# Patient Record
Sex: Female | Born: 1960 | Race: White | Hispanic: No | Marital: Married | Smoking: Never smoker
Health system: Southern US, Community
[De-identification: ages and names within clinical notes are randomized; demographics above are authoritative.]

## PROBLEM LIST (undated history)

## (undated) DIAGNOSIS — E041 Nontoxic single thyroid nodule: Secondary | ICD-10-CM

## (undated) DIAGNOSIS — I2699 Other pulmonary embolism without acute cor pulmonale: Secondary | ICD-10-CM

## (undated) DIAGNOSIS — F419 Anxiety disorder, unspecified: Secondary | ICD-10-CM

## (undated) DIAGNOSIS — Z8051 Family history of malignant neoplasm of kidney: Secondary | ICD-10-CM

## (undated) DIAGNOSIS — E119 Type 2 diabetes mellitus without complications: Secondary | ICD-10-CM

## (undated) DIAGNOSIS — L409 Psoriasis, unspecified: Secondary | ICD-10-CM

## (undated) DIAGNOSIS — K760 Fatty (change of) liver, not elsewhere classified: Secondary | ICD-10-CM

## (undated) DIAGNOSIS — I10 Essential (primary) hypertension: Secondary | ICD-10-CM

## (undated) DIAGNOSIS — C801 Malignant (primary) neoplasm, unspecified: Secondary | ICD-10-CM

## (undated) HISTORY — PX: CHOLECYSTECTOMY: SHX55

## (undated) HISTORY — PX: SHOULDER SURGERY: SHX246

## (undated) HISTORY — PX: PARTIAL NEPHRECTOMY: SHX414

---

## 1997-11-25 ENCOUNTER — Ambulatory Visit (HOSPITAL_COMMUNITY): Admission: RE | Admit: 1997-11-25 | Discharge: 1997-11-25 | Payer: Self-pay | Admitting: Obstetrics and Gynecology

## 1998-01-10 ENCOUNTER — Inpatient Hospital Stay (HOSPITAL_COMMUNITY): Admission: AD | Admit: 1998-01-10 | Discharge: 1998-01-12 | Payer: Self-pay | Admitting: Obstetrics and Gynecology

## 1998-01-24 ENCOUNTER — Inpatient Hospital Stay (HOSPITAL_COMMUNITY): Admission: AD | Admit: 1998-01-24 | Discharge: 1998-01-28 | Payer: Self-pay | Admitting: Obstetrics and Gynecology

## 1998-01-28 ENCOUNTER — Encounter (HOSPITAL_COMMUNITY): Admission: RE | Admit: 1998-01-28 | Discharge: 1998-04-07 | Payer: Self-pay | Admitting: *Deleted

## 1998-03-01 ENCOUNTER — Other Ambulatory Visit: Admission: RE | Admit: 1998-03-01 | Discharge: 1998-03-01 | Payer: Self-pay | Admitting: Obstetrics and Gynecology

## 2005-08-28 ENCOUNTER — Encounter: Payer: Self-pay | Admitting: Obstetrics and Gynecology

## 2006-07-11 ENCOUNTER — Emergency Department (HOSPITAL_COMMUNITY): Admission: EM | Admit: 2006-07-11 | Discharge: 2006-07-11 | Payer: Self-pay | Admitting: Emergency Medicine

## 2006-07-13 ENCOUNTER — Emergency Department (HOSPITAL_COMMUNITY): Admission: EM | Admit: 2006-07-13 | Discharge: 2006-07-13 | Payer: Self-pay | Admitting: Emergency Medicine

## 2006-07-14 ENCOUNTER — Encounter: Admission: RE | Admit: 2006-07-14 | Discharge: 2006-07-14 | Payer: Self-pay | Admitting: Orthopaedic Surgery

## 2006-08-04 ENCOUNTER — Ambulatory Visit (HOSPITAL_COMMUNITY): Admission: RE | Admit: 2006-08-04 | Discharge: 2006-08-05 | Payer: Self-pay | Admitting: Orthopaedic Surgery
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
## 2017-11-06 ENCOUNTER — Ambulatory Visit: Payer: No Typology Code available for payment source | Admitting: Podiatry

## 2017-11-06 ENCOUNTER — Encounter: Payer: Self-pay | Admitting: Podiatry

## 2017-11-06 VITALS — BP 143/83 | HR 74 | Temp 98.7°F | Resp 16 | Ht 66.0 in | Wt 273.0 lb

## 2017-11-06 DIAGNOSIS — L603 Nail dystrophy: Secondary | ICD-10-CM

## 2017-11-06 DIAGNOSIS — L853 Xerosis cutis: Secondary | ICD-10-CM

## 2017-11-06 DIAGNOSIS — E1149 Type 2 diabetes mellitus with other diabetic neurological complication: Secondary | ICD-10-CM

## 2017-11-06 MED ORDER — AMMONIUM LACTATE 12 % EX CREA: TOPICAL_CREAM | CUTANEOUS | 0 refills | Status: DC | PRN

## 2017-11-06 NOTE — Progress Notes (Signed)
   Subjective:    Patient ID: Sara Page, female    DOB: April 27, 1961, 57 y.o.   MRN: [id number]  HPI 57 year old female presents the office today with concerns of a left toenail problem.  She states the nail has been long with starting to lift up.  She states that she has not noticed any drainage or redness or any swelling.  She has been put a Band-Aid on the area.  She previously had partial nail avulsions performed of both of her big toenails.  The right side is done well but the left side has become very Fan and narrow.  She also states that her skin is very dry in the bottom of her heels but denies any fissuring or open sores.   Review of Systems  All other systems reviewed and are negative.  No past medical history on file.     Current Outpatient Medications:  .  B Complex Vitamins (B-COMPLEX/B-12 PO), Take by mouth., Disp: , Rfl:  .  escitalopram (LEXAPRO) 5 MG tablet, Take 5 mg by mouth daily., Disp: , Rfl:  .  glipiZIDE (GLUCOTROL) 5 MG tablet, Take by mouth daily before breakfast., Disp: , Rfl:  .  insulin glargine (LANTUS) 100 UNIT/ML injection, Inject into the skin daily., Disp: , Rfl:  .  losartan (COZAAR) 100 MG tablet, Take 100 mg by mouth daily., Disp: , Rfl:  .  ammonium lactate (AMLACTIN) 12 % cream, Apply topically as needed for dry skin., Disp: 385 g, Rfl: 0  Allergies  Allergen Reactions  . Penicillins Hives         Objective:   Physical Exam  General: AAO x3, NAD  Dermatological: The left hallux toenail is dystrophic with discoloration of the nail.  There is no pain in the nails yesterday redness or drainage and clinical signs of infection present.  There is dry skin present plantar heels without any skin fissures open sores present.  Vascular: Dorsalis Pedis artery and Posterior Tibial artery pedal pulses are 2/4 bilateral with immedate capillary fill time. There is no pain with calf compression, swelling, warmth, erythema.   Neruologic: Grossly  intact via light touch bilateral. Protective threshold with Semmes Wienstein monofilament intact to all pedal sites bilateral.   Musculoskeletal: No gross boney pedal deformities bilateral. No pain, crepitus, or limitation noted with foot and ankle range of motion bilateral. Muscular strength 5/5 in all groups tested bilateral.  Gait: Unassisted, Nonantalgic.      Assessment & Plan:  57 year old female with onychodystrophy left hallux toenail, dry skin -Treatment options discussed including all alternatives, risks, and complications -Etiology of symptoms were discussed - I did debride the left hallux toenail so that any complications or bleeding.  Discussed total nail avulsion but she wished to hold off on this.  The nail is very dystrophic but I do not believe there is much we can do for other than keeping it trimmed. -Prescribed AmLactin for the dry skin. -Discussed the importance of daily foot inspection.  Trula Slade DPM

## 2017-11-06 NOTE — Patient Instructions (Addendum)
You can mix 2 tablespoons of coconut oil and 10-15 drops of tea tree oil together and apply to the nail once a day  If was nice to meet you today. If you have any questions or any further concerns, please feel fee to give me a call. You can call our office at [phone] or please feel fee to send me a message through [place].      Diabetes and Foot Care Diabetes may cause you to have problems because of poor blood supply (circulation) to your feet and legs. This may cause the skin on your feet to become thinner, break easier, and heal more slowly. Your skin may become dry, and the skin may peel and crack. You may also have nerve damage in your legs and feet causing decreased feeling in them. You may not notice minor injuries to your feet that could lead to infections or more serious problems. Taking care of your feet is one of the most important things you can do for yourself. Follow these instructions at home:  Wear shoes at all times, even in the house. Do not go barefoot. Bare feet are easily injured.  Check your feet daily for blisters, cuts, and redness. If you cannot see the bottom of your feet, use a mirror or ask someone for help.  Wash your feet with warm water (do not use hot water) and mild soap. Then pat your feet and the areas between your toes until they are completely dry. Do not soak your feet as this can dry your skin.  Apply a moisturizing lotion or petroleum jelly (that does not contain alcohol and is unscented) to the skin on your feet and to dry, brittle toenails. Do not apply lotion between your toes.  Trim your toenails straight across. Do not dig under them or around the cuticle. File the edges of your nails with an emery board or nail file.  Do not cut corns or calluses or try to remove them with medicine.  Wear clean socks or stockings every day. Make sure they are not too tight. Do not wear knee-high stockings since they may decrease blood flow to your  legs.  Wear shoes that fit properly and have enough cushioning. To break in new shoes, wear them for just a few hours a day. This prevents you from injuring your feet. Always look in your shoes before you put them on to be sure there are no objects inside.  Do not cross your legs. This may decrease the blood flow to your feet.  If you find a minor scrape, cut, or break in the skin on your feet, keep it and the skin around it clean and dry. These areas may be cleansed with mild soap and water. Do not cleanse the area with peroxide, alcohol, or iodine.  When you remove an adhesive bandage, be sure not to damage the skin around it.  If you have a wound, look at it several times a day to make sure it is healing.  Do not use heating pads or hot water bottles. They may burn your skin. If you have lost feeling in your feet or legs, you may not know it is happening until it is too late.  Make sure your health care provider performs a complete foot exam at least annually or more often if you have foot problems. Report any cuts, sores, or bruises to your health care provider immediately. Contact a health care provider if:  You have an injury  that is not healing.  You have cuts or breaks in the skin.  You have an ingrown nail.  You notice redness on your legs or feet.  You feel burning or tingling in your legs or feet.  You have pain or cramps in your legs and feet.  Your legs or feet are numb.  Your feet always feel cold. Get help right away if:  There is increasing redness, swelling, or pain in or around a wound.  There is a red line that goes up your leg.  Pus is coming from a wound.  You develop a fever or as directed by your health care provider.  You notice a bad smell coming from an ulcer or wound. This information is not intended to replace advice given to you by your health care provider. Make sure you discuss any questions you have with your health care provider. Document  Released: 04/12/2000 Document Revised: 09/21/2015 Document Reviewed: 09/22/2012 Elsevier Interactive Patient Education  2017 [employer].

## 2019-06-26 ENCOUNTER — Ambulatory Visit: Payer: Self-pay | Attending: Internal Medicine

## 2019-06-26 DIAGNOSIS — Z23 Encounter for immunization: Secondary | ICD-10-CM | POA: Insufficient documentation

## 2019-06-26 NOTE — Progress Notes (Signed)
   Covid-19 Vaccination Clinic  Name:  Sara Page    MRN: [id number] DOB: Oct 08, 1960  06/26/2019  [name] was observed post Covid-19 immunization for 30 minutes based on pre-vaccination screening without incidence. She was provided with Vaccine Information Sheet and instruction to access the V-Safe system.   [name] was instructed to call 911 with any severe reactions post vaccine: Marland Kitchen Difficulty breathing  . Swelling of your face and throat  . A fast heartbeat  . A bad rash all over your body  . Dizziness and weakness    Immunizations Administered    Name Date Dose VIS Date Route   Pfizer COVID-19 Vaccine 06/26/2019  1:22 PM 0.3 mL 04/09/2019 Intramuscular   Manufacturer: [place]   Lot: [id number]   [place]: [id number]

## 2019-07-17 ENCOUNTER — Ambulatory Visit: Payer: Self-pay | Attending: Internal Medicine

## 2019-07-17 DIAGNOSIS — Z23 Encounter for immunization: Secondary | ICD-10-CM

## 2019-07-17 NOTE — Progress Notes (Signed)
   Covid-19 Vaccination Clinic  Name:  KIYRA FAUROT    MRN: [id number] DOB: Jun 12, 1960  07/17/2019  Ms. Masters was observed post Covid-19 immunization for 15 minutes without incident. She was provided with Vaccine Information Sheet and instruction to access the V-Safe system.   Ms. Jemerson was instructed to call 911 with any severe reactions post vaccine: Marland Kitchen Difficulty breathing  . Swelling of face and throat  . A fast heartbeat  . A bad rash all over body  . Dizziness and weakness   Immunizations Administered    Name Date Dose VIS Date Route   Pfizer COVID-19 Vaccine 07/17/2019  9:52 AM 0.3 mL 04/09/2019 Intramuscular   Manufacturer: [place]   Lot: [id number]   [place]: [id number]

## 2020-01-05 ENCOUNTER — Encounter (HOSPITAL_BASED_OUTPATIENT_CLINIC_OR_DEPARTMENT_OTHER): Payer: Self-pay | Admitting: Emergency Medicine

## 2020-01-05 ENCOUNTER — Emergency Department (HOSPITAL_BASED_OUTPATIENT_CLINIC_OR_DEPARTMENT_OTHER)
Admission: EM | Admit: 2020-01-05 | Discharge: 2020-01-05 | Disposition: A | Discharge status: Home / Self Care | Payer: BC Managed Care – PPO | Attending: Emergency Medicine | Admitting: Emergency Medicine

## 2020-01-05 ENCOUNTER — Emergency Department (HOSPITAL_BASED_OUTPATIENT_CLINIC_OR_DEPARTMENT_OTHER): Payer: BC Managed Care – PPO

## 2020-01-05 DIAGNOSIS — C649 Malignant neoplasm of unspecified kidney, except renal pelvis: Secondary | ICD-10-CM | POA: Insufficient documentation

## 2020-01-05 DIAGNOSIS — Z79899 Other long term (current) drug therapy: Secondary | ICD-10-CM | POA: Insufficient documentation

## 2020-01-05 DIAGNOSIS — I10 Essential (primary) hypertension: Secondary | ICD-10-CM | POA: Insufficient documentation

## 2020-01-05 DIAGNOSIS — E119 Type 2 diabetes mellitus without complications: Secondary | ICD-10-CM | POA: Insufficient documentation

## 2020-01-05 DIAGNOSIS — M25511 Pain in right shoulder: Secondary | ICD-10-CM | POA: Insufficient documentation

## 2020-01-05 HISTORY — DX: Nontoxic single thyroid nodule: E04.1

## 2020-01-05 HISTORY — DX: Fatty (change of) liver, not elsewhere classified: K76.0

## 2020-01-05 HISTORY — DX: Malignant (primary) neoplasm, unspecified: C80.1

## 2020-01-05 HISTORY — DX: Family history of malignant neoplasm of kidney: Z80.51

## 2020-01-05 HISTORY — DX: Essential (primary) hypertension: I10

## 2020-01-05 HISTORY — DX: Psoriasis, unspecified: L40.9

## 2020-01-05 HISTORY — DX: Other pulmonary embolism without acute cor pulmonale: I26.99

## 2020-01-05 HISTORY — DX: Anxiety disorder, unspecified: F41.9

## 2020-01-05 HISTORY — DX: Type 2 diabetes mellitus without complications: E11.9

## 2020-01-05 MED ORDER — METHOCARBAMOL 500 MG PO TABS: 500.0000 mg | ORAL_TABLET | Freq: Two times a day (BID) | ORAL | 0 refills | Status: DC

## 2020-01-05 MED ORDER — OXYCODONE-ACETAMINOPHEN 5-325 MG PO TABS
1.0000 | ORAL_TABLET | Freq: Once | ORAL | Status: AC
  Administered 2020-01-05: 1 via ORAL
  Filled 2020-01-05: qty 1

## 2020-01-05 MED ORDER — NAPROXEN 375 MG PO TABS: 375.0000 mg | ORAL_TABLET | Freq: Two times a day (BID) | ORAL | 0 refills | Status: AC

## 2020-01-05 NOTE — ED Provider Notes (Signed)
[facility] EMERGENCY DEPARTMENT Provider Note   CSN: [id number] Arrival date & time: 01/05/20  0606     History Chief Complaint  Patient presents with  . Shoulder Pain    Sara Page is a 59 y.o. female past medical history of diabetes, hypertension, thyroid nodule who presents for evaluation of right shoulder pain that began yesterday.  She denies any preceding trauma, injury, fall.  She states that overnight, the pain significantly worsened.  She reports limited range of motion of her shoulder secondary to pain.  She has not noticed any warmth.  She states she always has some erythema to her skin but has not noticed any changes in her skin.  She denies any fevers, numbness/weakness of the arm.  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  She does not do any heavy lifting but does report she did some occasional movement of the upper arm during her job.  The history is provided by the patient.       Past Medical History:  Diagnosis Date  . Anxiety   . Cancer ([place])   . Diabetes mellitus without complication ([place])   . FH: kidney cancer   . Hypertension   . NAFLD (nonalcoholic fatty liver disease)   . Psoriasis   . Pulmonary emboli ([place])   . Thyroid nodule     There are no problems to display for this patient.   Past Surgical History:  Procedure Laterality Date  . CESAREAN SECTION    . CHOLECYSTECTOMY    . PARTIAL NEPHRECTOMY    . SHOULDER SURGERY       OB History   No obstetric history on file.     History reviewed. No pertinent family history.  Social History   Tobacco Use  . Smoking status: Never Smoker  . Smokeless tobacco: Never Used  Vaping Use  . Vaping Use: Never used  Substance Use Topics  . Alcohol use: Never  . Drug use: Never    Home Medications Prior to Admission medications   Medication Sig Start Date End Date Taking? Authorizing Provider  ammonium lactate (AMLACTIN) 12 % cream Apply topically as needed for dry  skin. 11/06/17   Trula Slade, DPM  B Complex Vitamins (B-COMPLEX/B-12 PO) Take by mouth.    [provider]  escitalopram (LEXAPRO) 5 MG tablet Take 5 mg by mouth daily.    [provider]  glipiZIDE (GLUCOTROL) 5 MG tablet Take by mouth daily before breakfast.    [provider]  insulin glargine (LANTUS) 100 UNIT/ML injection Inject into the skin daily.    [provider]  losartan (COZAAR) 100 MG tablet Take 100 mg by mouth daily.    [provider]  methocarbamol (ROBAXIN) 500 MG tablet Take 1 tablet (500 mg total) by mouth 2 (two) times daily. 01/05/20   Volanda Napoleon, PA-C  naproxen (NAPROSYN) 375 MG tablet Take 1 tablet (375 mg total) by mouth 2 (two) times daily for 7 days. 01/05/20 01/12/20  Volanda Napoleon, PA-C    Allergies    Penicillins  Review of Systems   Review of Systems  Constitutional: Negative for fever.  Musculoskeletal:       Right shoulder pain  Skin: Negative for color change.  Neurological: Negative for weakness and numbness.  All other systems reviewed and are negative.   Physical Exam Updated Vital Signs BP (!) 158/65 (BP Location: Left Arm)   Pulse 90   Temp 98.5 F (  36.9 C) (Oral)   Resp 20   Ht 5\' 6"  (1.676 m)   Wt 121.6 kg   SpO2 98%   BMI 43.26 kg/m   Physical Exam Vitals and nursing note reviewed.  Constitutional:      Appearance: She is well-developed.  HENT:     Head: Normocephalic and atraumatic.  Eyes:     General: No scleral icterus.       Right eye: No discharge.        Left eye: No discharge.     Conjunctiva/sclera: Conjunctivae normal.  Cardiovascular:     Pulses:          Radial pulses are 2+ on the right side and 2+ on the left side.  Pulmonary:     Effort: Pulmonary effort is normal.  Musculoskeletal:     Comments: Tenderness palpation noted to right shoulder.  No deformity or crepitus noted.  No overlying warmth, erythema, edema.  Limited range of motion secondary to  pain.  I am only able to get to about 80 degrees of flexion before she has a a lot of pain.  Additionally, I am unable to fully abduct her right shoulder.  No bony tenderness noted to right elbow, right forearm, right wrist.  Unable to assess Neer's impingement.  Positive Hawkins.  Positive empty can test.  Unable to assess liftoff test.  No bony tenderness in left shoulder.  She does have some limited flexion of left shoulder secondary to previous injury.  Negative Neer's, Hawkins, liftoff, liftoff test.  Skin:    General: Skin is warm and dry.     Capillary Refill: Capillary refill takes less than 2 seconds.     Comments: Good distal cap refill.  RUE is not dusky in appearance or cool to touch.  Neurological:     Mental Status: She is alert.     Comments: 5/5 strength BUE  Sensation intact along major nerve distributions of BUE  Psychiatric:        Speech: Speech normal.        Behavior: Behavior normal.     ED Results / Procedures / Treatments   Labs (all labs ordered are listed, but only abnormal results are displayed) Labs Reviewed - No data to display  EKG None  Radiology DG Shoulder Right  Result Date: 01/05/2020 CLINICAL DATA:  Acute right shoulder pain without known injury. EXAM: RIGHT SHOULDER - 2+ VIEW COMPARISON:  None. FINDINGS: There is no evidence of fracture or dislocation. There is no evidence of arthropathy. Calcification is seen over greater tuberosity suggesting calcific tendinosis. Soft tissues are unremarkable. 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 No acute abnormality seen in the right shoulder. Electronically Signed   By: Marijo Conception M.D.   On: 01/05/2020 09:58    Procedures Procedures (including critical care time)  Medications Ordered in ED Medications  oxyCODONE-acetaminophen (PERCOCET/ROXICET) 5-325 MG per tablet 1 tablet (1 tablet Oral Given 01/05/20 1027)    ED Course  I have reviewed the triage vital signs and the nursing  notes.  Pertinent labs & imaging results that were available during my care of the patient were reviewed by me and considered in my medical decision making (see chart for details).    MDM Rules/Calculators/A&P                          59 year old female who presents for evaluation of right shoulder pain that began yesterday.  No  preceding trauma, injury, fall.  Limited range of motion secondary to pain.  Initial arrival, she is afebrile, nontoxic-appearing.  Vital signs are stable.  She is neurovascularly intact.  Concern for possible rotator cuff impingement versus tendinitis.  History/physical exam not concerning for DVT, septic arthritis, ischemic limb.  Additionally, history/physical exam is not concerning for CVA.  Patient given analgesics.  Will assess x-ray for any bony abnormality.  X-ray reviewed.  No acute bony abnormality.  There is probable calcific tendinosis of the rotator cuff.  Discussed results with patient.  Will give patient short course of muscle relaxers and NSAIDs for additional pain relief.  Patient placed in sling immobilizer and encouraged on use.  Patient given outpatient orthopedic referral for further evaluation of her symptoms. At this time, patient exhibits no emergent life-threatening condition that require further evaluation in ED or admission. Patient had ample opportunity for questions and discussion. All patient's questions were answered with full understanding. Strict return precautions discussed. Patient expresses understanding and agreement to plan.   Portions of this note were generated with [employer]. Dictation errors may occur despite best attempts at proofreading.  Final Clinical Impression(s) / ED Diagnoses Final diagnoses:  Acute pain of right shoulder    Rx / DC Orders ED Discharge Orders         Ordered    methocarbamol (ROBAXIN) 500 MG tablet  2 times daily        01/05/20 1025    naproxen (NAPROSYN) 375 MG tablet  2 times daily         01/05/20 1025           Desma Mcgregor 01/05/20 1038    Hayden Rasmussen, MD 01/05/20 [phone]

## 2020-01-05 NOTE — ED Triage Notes (Signed)
Pt states she started having pain in her right shoulder yesterday that has progressively gotten worse over night  Pt states today she is hardly able to move her right arm due to the pain  Pt states she has not taken anything for the pain

## 2020-01-05 NOTE — Discharge Instructions (Addendum)
Take Naproxen as directed. As we discussed you can  take tylenol with this medication but you should not take ibuprofen at the same time as taking this medication.   Take Robaxin as prescribed. This medication will make you drowsy so do not drive or drink alcohol when taking it.  Wear sling immobilizer for support and stabilization.  As we discussed, you should not wear it 24/7.  At home, he should take it out and do gentle range of motion exercises to prevent frozen shoulder syndrome.  Follow-up with referred orthopedic doctor for further evaluation.  Return For any worsening pain, numbness, redness or warmth, fevers or any other worsening or concerning symptoms.

## 2023-05-25 ENCOUNTER — Other Ambulatory Visit: Payer: Self-pay

## 2023-05-25 ENCOUNTER — Encounter (HOSPITAL_COMMUNITY): Payer: Self-pay

## 2023-05-25 ENCOUNTER — Emergency Department (HOSPITAL_COMMUNITY)
Admission: EM | Admit: 2023-05-25 | Discharge: 2023-05-25 | Disposition: A | Discharge status: Home / Self Care | Payer: No Typology Code available for payment source | Attending: Emergency Medicine | Admitting: Emergency Medicine

## 2023-05-25 DIAGNOSIS — I10 Essential (primary) hypertension: Secondary | ICD-10-CM | POA: Diagnosis not present

## 2023-05-25 DIAGNOSIS — D45 Polycythemia vera: Secondary | ICD-10-CM | POA: Diagnosis not present

## 2023-05-25 DIAGNOSIS — R002 Palpitations: Secondary | ICD-10-CM | POA: Diagnosis present

## 2023-05-25 DIAGNOSIS — I480 Paroxysmal atrial fibrillation: Secondary | ICD-10-CM | POA: Diagnosis not present

## 2023-05-25 DIAGNOSIS — Z794 Long term (current) use of insulin: Secondary | ICD-10-CM | POA: Insufficient documentation

## 2023-05-25 DIAGNOSIS — Z79899 Other long term (current) drug therapy: Secondary | ICD-10-CM | POA: Diagnosis not present

## 2023-05-25 DIAGNOSIS — Z85528 Personal history of other malignant neoplasm of kidney: Secondary | ICD-10-CM | POA: Diagnosis not present

## 2023-05-25 DIAGNOSIS — Z7901 Long term (current) use of anticoagulants: Secondary | ICD-10-CM | POA: Insufficient documentation

## 2023-05-25 DIAGNOSIS — Z7984 Long term (current) use of oral hypoglycemic drugs: Secondary | ICD-10-CM | POA: Diagnosis not present

## 2023-05-25 DIAGNOSIS — D751 Secondary polycythemia: Secondary | ICD-10-CM

## 2023-05-25 DIAGNOSIS — E119 Type 2 diabetes mellitus without complications: Secondary | ICD-10-CM | POA: Diagnosis not present

## 2023-05-25 LAB — BASIC METABOLIC PANEL
Anion gap: 11 (ref 5–15)
BUN: 15 mg/dL (ref 8–23)
CO2: 19 mmol/L — ABNORMAL LOW (ref 22–32)
Calcium: 8.6 mg/dL — ABNORMAL LOW (ref 8.9–10.3)
Chloride: 108 mmol/L (ref 98–111)
Creatinine, Ser: 0.54 mg/dL (ref 0.44–1.00)
GFR, Estimated: >60 mL/min (ref >60)
Glucose, Bld: 206 mg/dL — ABNORMAL HIGH (ref 70–99)
Potassium: 4 mmol/L (ref 3.5–5.1)
Sodium: 138 mmol/L (ref 135–145)

## 2023-05-25 LAB — CBC
HCT: 47.6 % — ABNORMAL HIGH (ref 36.0–46.0)
Hemoglobin: 15.8 g/dL — ABNORMAL HIGH (ref 12.0–15.0)
MCH: 29.9 pg (ref 26.0–34.0)
MCHC: 33.2 g/dL (ref 30.0–36.0)
MCV: 90 fL (ref 80.0–100.0)
Platelets: 278 10*3/uL (ref 150–400)
RBC: 5.29 MIL/uL — ABNORMAL HIGH (ref 3.87–5.11)
RDW: 13.4 % (ref 11.5–15.5)
WBC: 7.9 10*3/uL (ref 4.0–10.5)
nRBC: 0 % (ref 0.0–0.2)

## 2023-05-25 LAB — MAGNESIUM: Magnesium: 2 mg/dL (ref 1.7–2.4)

## 2023-05-25 MED ORDER — APIXABAN 5 MG PO TABS
5.0000 mg | ORAL_TABLET | Freq: Once | ORAL | Status: AC
  Administered 2023-05-25: 5 mg via ORAL
  Filled 2023-05-25: qty 1

## 2023-05-25 MED ORDER — APIXABAN 5 MG PO TABS: 5.0000 mg | ORAL_TABLET | Freq: Two times a day (BID) | ORAL | 0 refills | Status: DC

## 2023-05-25 MED ORDER — PROPOFOL 10 MG/ML IV BOLUS: 0.5000 mg/kg | Freq: Once | INTRAVENOUS | Status: DC

## 2023-05-25 MED ORDER — PROPOFOL 10 MG/ML IV BOLUS
INTRAVENOUS | Status: AC | PRN
  Administered 2023-05-25: 58.05 mg via INTRAVENOUS

## 2023-05-25 NOTE — ED Provider Notes (Signed)
[facility] Provider Note   CSN: [id number] Arrival date & time: 05/25/23  0272     History  Chief Complaint  Patient presents with   Atrial Fibrillation    Sara Page is a 63 y.o. female.  The history is provided by the patient.  Atrial Fibrillation  She has history of hypertension, diabetes, hyperlipidemia, pulmonary embolism, renal cell carcinoma and comes in because of palpitations.  She noted before she went to sleep that her heart was racing and she felt slightly short of breath.  She went to sleep but woke up feeling worse.  She denies chest pain, heaviness, tightness, pressure.  She does feel that her heart is racing.  She denies nausea, vomiting, diaphoresis.  She has never had similar episodes in the past.  Of note, pulmonary embolism was a postoperative complication.  EMS noted atrial fibrillation with rapid ventricular response treated with diltiazem 20 mg which temporarily brought the heart rate down to the 120s.   Home Medications Prior to Admission medications   Medication Sig Start Date End Date Taking? Authorizing Provider  apixaban (ELIQUIS) 5 MG TABS tablet Take 1 tablet (5 mg total) by mouth 2 (two) times daily. 05/25/23 06/24/23 Yes Dione Booze, MD  ammonium lactate (AMLACTIN) 12 % cream Apply topically as needed for dry skin. 11/06/17   Vivi Barrack, DPM  B Complex Vitamins (B-COMPLEX/B-12 PO) Take by mouth.    [provider]  escitalopram (LEXAPRO) 5 MG tablet Take 5 mg by mouth daily.    [provider]  glipiZIDE (GLUCOTROL) 5 MG tablet Take by mouth daily before breakfast.    [provider]  insulin glargine (LANTUS) 100 UNIT/ML injection Inject into the skin daily.    [provider]  losartan (COZAAR) 100 MG tablet Take 100 mg by mouth daily.    [provider]  methocarbamol (ROBAXIN) 500 MG tablet Take 1 tablet (500 mg total) by mouth 2 (two) times  daily. 01/05/20   Maxwell Caul, PA-C      Allergies    Penicillins    Review of Systems   Review of Systems  All other systems reviewed and are negative.   Physical Exam Updated Vital Signs BP (!) 141/82   Pulse (!) 135   Resp 20   Ht 5\' 6"  (1.676 m)   Wt 116.1 kg   SpO2 96%   BMI 41.32 kg/m  Physical Exam Vitals and nursing note reviewed.   63 year old female, resting comfortably and in no acute distress. Vital signs are significant for elevated heart rate and borderline elevated blood pressure. Oxygen saturation is 96%, which is normal. Head is normocephalic and atraumatic. PERRLA, EOMI. Oropharynx is clear. Neck is nontender and supple without adenopathy or JVD. Lungs are clear without rales, wheezes, or rhonchi. Chest is nontender. Heart is tachycardic and irregular without murmur. Abdomen is soft, flat, nontender. Extremities have no cyanosis or edema,. Neurologic: Mental status is normal, cranial nerves are intact, there are no motor or sensory deficits.  ED Results / Procedures / Treatments   Labs (all labs ordered are listed, but only abnormal results are displayed) Labs Reviewed  CBC - Abnormal; Notable for the following components:      Result Value   RBC 5.29 (*)    Hemoglobin 15.8 (*)    HCT 47.6 (*)    All other components within normal limits  BASIC METABOLIC PANEL  MAGNESIUM  EKG EKG Interpretation Date/Time:  Sunday May 25 2023 05:40:54 EST Ventricular Rate:  150 PR Interval:    QRS Duration:  98 QT Interval:  300 QTC Calculation: 474 R Axis:   111  Text Interpretation: Atrial fibrillation Left posterior fascicular block ST depression, probably rate related When compared with ECG of 08/01/2006, Atrial fibrillation with rapid ventricular response has replaced Sinus rhythm Left posterior fasicular block is now Present Confirmed by Dione Booze ([id number]) on 05/25/2023 5:44:42 AM   EKG Interpretation Date/Time:  Sunday May 25 2023  07:46:45 EST Ventricular Rate:  100 PR Interval:  124 QRS Duration:  95 QT Interval:  356 QTC Calculation: 460 R Axis:   93  Text Interpretation: Sinus tachycardia Probable left atrial enlargement Right axis deviation When compared with ECG of EARLIER SAME DATE S1-S2-S3 pattern, consider pulmonary disease, RVH, or normal variant has replaced Atrial fibrillation with rapid ventricular response Confirmed by Dione Booze ([id number]) on 05/25/2023 7:51:29 AM         Procedures .Sedation  Date/Time: 05/25/2023 7:17 AM  Performed by: Dione Booze, MD Authorized by: Dione Booze, MD   Consent:    Consent obtained:  Verbal   Consent given by:  Patient   Risks discussed:  Allergic reaction, dysrhythmia, inadequate sedation, nausea, prolonged hypoxia resulting in organ damage, prolonged sedation necessitating reversal, respiratory compromise necessitating ventilatory assistance and intubation and vomiting   Alternatives discussed:  Analgesia without sedation, anxiolysis and regional anesthesia Universal protocol:    Procedure explained and questions answered to patient or proxy's satisfaction: yes     Relevant documents present and verified: yes     Test results available: yes     Imaging studies available: yes     Required blood products, implants, devices, and special equipment available: yes     Site/side marked: yes     Immediately prior to procedure, a time out was called: yes     Patient identity confirmed:  Verbally with patient and arm band Indications:    Procedure performed:  Cardioversion   Procedure necessitating sedation performed by:  Physician performing sedation Pre-sedation assessment:    Time since last food or drink:  6 hours   ASA classification: class 2 - patient with mild systemic disease     Mouth opening:  3 or more finger widths   Thyromental distance:  4 finger widths   Mallampati score:  I - soft palate, uvula, fauces, pillars visible   Neck mobility: normal      Pre-sedation assessments completed and reviewed: airway patency, cardiovascular function, hydration status, mental status, nausea/vomiting, pain level, respiratory function and temperature   A pre-sedation assessment was completed prior to the start of the procedure Immediate pre-procedure details:    Reassessment: Patient reassessed immediately prior to procedure     Reviewed: vital signs, relevant labs/tests and NPO status     Verified: bag valve mask available, emergency equipment available, intubation equipment available, IV patency confirmed, oxygen available and suction available   Procedure details (see MAR for exact dosages):    Preoxygenation:  Nasal cannula   Sedation:  Propofol   Intended level of sedation: deep   Intra-procedure monitoring:  Blood pressure monitoring, cardiac monitor, continuous pulse oximetry, frequent LOC assessments, frequent vital sign checks and continuous capnometry   Intra-procedure events: none     Total Provider sedation time (minutes):  30 Post-procedure details:   A post-sedation assessment was completed following the completion of the procedure.   Attendance: Constant attendance by certified  staff until patient recovered     Recovery: Patient returned to pre-procedure baseline     Post-sedation assessments completed and reviewed: airway patency, cardiovascular function, hydration status, mental status, nausea/vomiting, pain level, respiratory function and temperature     Patient is stable for discharge or admission: yes     Procedure completion:  Tolerated well, no immediate complications .Cardioversion  Date/Time: 05/25/2023 7:18 AM  Performed by: Dione Booze, MD Authorized by: Dione Booze, MD   Consent:    Consent obtained:  Written   Consent given by:  Patient   Risks discussed:  Cutaneous burn, death, pain and induced arrhythmia   Alternatives discussed:  Rate-control medication Pre-procedure details:    Cardioversion basis:   Emergent   Rhythm:  Atrial fibrillation   Electrode placement:  Anterior-posterior Patient sedated: Yes. Refer to sedation procedure documentation for details of sedation.  Attempt one:    Cardioversion mode:  Synchronous   Waveform:  Biphasic   Shock (Joules):  120   Shock outcome:  Conversion to normal sinus rhythm Post-procedure details:    Patient status:  Alert   Patient tolerance of procedure:  Tolerated well, no immediate complications   Cardiac monitor shows atrial fibrillation with rapid ventricular response.  Medications Ordered in ED Medications  apixaban (ELIQUIS) tablet 5 mg (has no administration in time range)  propofol (DIPRIVAN) 10 mg/mL bolus/IV push 58.1 mg (has no administration in time range)    ED Course/ Medical Decision Making/ A&P   Cardiac monitor shows atrial fibrillation with rapid ventricular response, per my interpretation.  Click here for ABCD2, HEART and other calculatorsREFRESH Note before signing :1}      CHA2DS2-VASc Score: 5                        Medical Decision Making Amount and/or Complexity of Data Reviewed Labs: ordered.  Risk Prescription drug management.   Atrial fibrillation with rapid ventricular response.  I have reviewed her electrocardiogram, my interpretation is atrial fibrillation with rapid ventricular response, left posterior fascicular block.  Both of these are new compared with ECG in 2008.  I have recommended DC cardioversion.  CHA2DS2-VASc score is 5 which puts her at elevated risk for stroke, will need lifelong anticoagulation.  I have reviewed her laboratory test, and my interpretation is elevated glucose consistent with known history of diabetes, mild polycythemia unchanged from baseline, normal magnesium.  Patient was very apprehensive about cardioversion.  I had an extensive discussion with the patient regarding the risks (pain, induced arrhythmia, cardiac asystole, respiratory depression) versus benefits  (immediate control of rhythm and ability to be discharged same day as opposed to hospitalization for 3-4 days).  Patient eventually decided to proceed with cardioversion.  After propofol sedation, patient was successfully cardioverted to sinus rhythm.  She will be discharged with prescription for apixaban and referral to atrial fibrillation clinic.  I have reviewed her repeat electrocardiogram, and my interpretation is sinus rhythm with right axis deviation consistent with left posterior fascicular block.  She has returned to her baseline mental status and is safe for discharge.  I am discharging her with a prescription for apixaban and referral to the atrial fibrillation clinic.  CRITICAL CARE Performed by: Dione Booze Total critical care time: 35 minutes Critical care time was exclusive of separately billable procedures and treating other patients. Critical care was necessary to treat or prevent imminent or life-threatening deterioration. Critical care was time spent personally by me on the following activities:  development of treatment plan with patient and/or surrogate as well as nursing, discussions with consultants, evaluation of patient's response to treatment, examination of patient, obtaining history from patient or surrogate, ordering and performing treatments and interventions, ordering and review of laboratory studies, ordering and review of radiographic studies, pulse oximetry and re-evaluation of patient's condition.  Final Clinical Impression(s) / ED Diagnoses Final diagnoses:  Paroxysmal atrial fibrillation (HCC)  Polycythemia    Rx / DC Orders ED Discharge Orders          Ordered    Amb referral to AFIB Clinic        05/25/23 0557    apixaban (ELIQUIS) 5 MG TABS tablet  2 times daily        05/25/23 0557              Dione Booze, MD 05/25/23 [phone]

## 2023-05-25 NOTE — ED Notes (Signed)
Patient has been given opportunity to ask questions, patient verbalized understanding of following up with afib clinic, Patient is alert and oriented at discharge, patient ambulated to ED lobby with her husband.

## 2023-05-25 NOTE — ED Triage Notes (Signed)
Patient came from home. Shortness of breath started around 2100 05/24/2023. Patient stated palpitations started with the shortness of breath. Resolved when EMS arrived. EMS palpated 74 HR. When on the monitor patient was showing A-fib rvr with 150-180. Patient received of NS and 20mg  of cardizem. No history of A-fib. After medications HR was in the 120s.  EMS VS 130/84 98% RA 24 RR 140 CBG

## 2023-05-27 ENCOUNTER — Encounter (HOSPITAL_COMMUNITY): Payer: Self-pay | Admitting: Physician Assistant

## 2023-05-27 ENCOUNTER — Ambulatory Visit (HOSPITAL_COMMUNITY)
Admission: RE | Admit: 2023-05-27 | Discharge: 2023-05-27 | Disposition: A | Discharge status: Home / Self Care | Payer: No Typology Code available for payment source | Source: Ambulatory Visit | Attending: Physician Assistant | Admitting: Physician Assistant

## 2023-05-27 VITALS — BP 146/76 | HR 73 | Ht 66.0 in | Wt 261.0 lb

## 2023-05-27 DIAGNOSIS — Z8550 Personal history of malignant neoplasm of unspecified urinary tract organ: Secondary | ICD-10-CM | POA: Diagnosis not present

## 2023-05-27 DIAGNOSIS — I480 Paroxysmal atrial fibrillation: Secondary | ICD-10-CM | POA: Diagnosis not present

## 2023-05-27 DIAGNOSIS — D6869 Other thrombophilia: Secondary | ICD-10-CM | POA: Insufficient documentation

## 2023-05-27 DIAGNOSIS — E785 Hyperlipidemia, unspecified: Secondary | ICD-10-CM | POA: Diagnosis present

## 2023-05-27 DIAGNOSIS — G4733 Obstructive sleep apnea (adult) (pediatric): Secondary | ICD-10-CM | POA: Diagnosis not present

## 2023-05-27 DIAGNOSIS — E119 Type 2 diabetes mellitus without complications: Secondary | ICD-10-CM | POA: Diagnosis present

## 2023-05-27 DIAGNOSIS — Z7901 Long term (current) use of anticoagulants: Secondary | ICD-10-CM | POA: Diagnosis not present

## 2023-05-27 DIAGNOSIS — Z86711 Personal history of pulmonary embolism: Secondary | ICD-10-CM | POA: Insufficient documentation

## 2023-05-27 DIAGNOSIS — I10 Essential (primary) hypertension: Secondary | ICD-10-CM | POA: Insufficient documentation

## 2023-05-27 MED ORDER — APIXABAN 5 MG PO TABS: 5.0000 mg | ORAL_TABLET | Freq: Two times a day (BID) | ORAL | 1 refills | Status: DC

## 2023-05-27 NOTE — Progress Notes (Signed)
Primary Care Physician: Cheron Schaumann., MD Primary Cardiologist: None Electrophysiologist: None  Referring Physician: ED    Sara Page is a 63 y.o. female with a history of HTN, DM, HLD, post operative PE, renal cell carcinoma, OSA, atrial fibrillation who presents for consultation in the [facility]. The patient was initially diagnosed with atrial fibrillation 05/25/23 after presenting to the ED with symptoms of tachypalpitations. ECG showed afib with RVR and she was initially treated with diltiazem which slowed the heart rate. She then underwent DCCV to restore to SR. She was discharged on Eliquis for stroke prevention. She remains in SR today. She denies alcohol use. She does have a remote history of OSA but has not used a CPAP in years.   Today, she denies symptoms of palpitations, chest pain, shortness of breath, orthopnea, PND, lower extremity edema, dizziness, presyncope, syncope, bleeding, or neurologic sequela. The patient is tolerating medications without difficulties and is otherwise without complaint today.    Atrial Fibrillation Risk Factors:  she does have symptoms or diagnosis of sleep apnea. she does not have a history of rheumatic fever. she does not have a history of alcohol use. The patient does not have a history of early familial atrial fibrillation or other arrhythmias.  Atrial Fibrillation Management history:  Previous antiarrhythmic drugs: none Previous cardioversions: 05/25/23 Previous ablations: none Anticoagulation history: Eliquis  ROS- All systems are reviewed and negative except as per the HPI above.  Past Medical History:  Diagnosis Date   Anxiety    Cancer (HCC)    Diabetes mellitus without complication (HCC)    FH: kidney cancer    Hypertension    NAFLD (nonalcoholic fatty liver disease)    Psoriasis    Pulmonary emboli (HCC)    Thyroid nodule     Current Outpatient Medications  Medication Sig  Dispense Refill   B Complex Vitamins (B-COMPLEX/B-12 PO) Take by mouth.     CRESTOR 5 MG tablet Take 1 tablet by mouth every Monday, Wednesday, and Friday.     cyanocobalamin (VITAMIN B12) 1000 MCG tablet Take 1 tablet by mouth daily.     escitalopram (LEXAPRO) 10 MG tablet Take 1 tablet by mouth daily.     ezetimibe (ZETIA) 10 MG tablet Take 1 tablet by mouth every Monday, Wednesday, and Friday.     glipiZIDE (GLUCOTROL) 5 MG tablet Take 5 mg by mouth 2 (two) times daily.     JARDIANCE 10 MG TABS tablet Take 10 mg by mouth daily.     losartan (COZAAR) 100 MG tablet Take 100 mg by mouth daily.     metoprolol tartrate (LOPRESSOR) 50 MG tablet Take 50 mg by mouth 2 (two) times daily.     Vitamin D, Ergocalciferol, (DRISDOL) 1.25 MG (50000 UNIT) CAPS capsule Take 1 capsule by mouth once a week.     apixaban (ELIQUIS) 5 MG TABS tablet Take 1 tablet (5 mg total) by mouth 2 (two) times daily. 180 tablet 1   No current facility-administered medications for this encounter.    Physical Exam: BP (!) 146/76   Pulse 73   Ht 5\' 6"  (1.676 m)   Wt 118.4 kg   BMI 42.13 kg/m   GEN: Well nourished, well developed in no acute distress NECK: No JVD; No carotid bruits CARDIAC: Regular rate and rhythm, no murmurs, rubs, gallops RESPIRATORY:  Clear to auscultation without rales, wheezing or rhonchi  ABDOMEN: Soft, non-tender, non-distended EXTREMITIES:  No edema; No deformity  Wt Readings from Last 3 Encounters:  05/27/23 118.4 kg  05/25/23 116.1 kg  01/05/20 121.6 kg     EKG today demonstrates  SR Vent. rate 73 BPM PR interval 142 ms QRS duration 94 ms QT/QTcB 406/447 ms    CHA2DS2-VASc Score = 3  The patient's score is based upon: CHF History: 0 HTN History: 1 Diabetes History: 1 Stroke History: 0 Vascular Disease History: 0 Age Score: 0 Gender Score: 1       ASSESSMENT AND PLAN: Paroxysmal Atrial Fibrillation (ICD10:  I48.0) The patient's CHA2DS2-VASc score is 3, indicating a  3.2% annual risk of stroke.   General education about afib provided and questions answered. We also discussed her stroke risk and the risks and benefits of anticoagulation. Check echocardiogram Continue Lopressor 50 mg BID Continue Eliquis 5 mg BID  Secondary Hypercoagulable State (ICD10:  D68.69) The patient is at significant risk for stroke/thromboembolism based upon her CHA2DS2-VASc Score of 3.  Continue Apixaban (Eliquis).   HTN Mildly elevated today. Continue to monitor for now.  OSA  The importance of adequate treatment of sleep apnea was discussed today in order to improve our ability to maintain sinus rhythm long term. She is not using a CPAP currently. She has deferred repeat sleep study for now.     Follow up in the AF clinic in 6 months. Will also refer her to establish care with a local cardiologist.       Jorja Loa PA-C Afib [facility] [address] [place], [place] [address] [phone]

## 2023-06-11 ENCOUNTER — Ambulatory Visit (HOSPITAL_COMMUNITY)
Admission: RE | Admit: 2023-06-11 | Discharge: 2023-06-11 | Disposition: A | Discharge status: Home / Self Care | Payer: No Typology Code available for payment source | Source: Ambulatory Visit | Attending: Physician Assistant | Admitting: Physician Assistant

## 2023-06-11 DIAGNOSIS — E119 Type 2 diabetes mellitus without complications: Secondary | ICD-10-CM | POA: Insufficient documentation

## 2023-06-11 DIAGNOSIS — I480 Paroxysmal atrial fibrillation: Secondary | ICD-10-CM | POA: Diagnosis not present

## 2023-06-11 DIAGNOSIS — I4891 Unspecified atrial fibrillation: Secondary | ICD-10-CM | POA: Insufficient documentation

## 2023-06-11 DIAGNOSIS — I10 Essential (primary) hypertension: Secondary | ICD-10-CM | POA: Insufficient documentation

## 2023-06-11 LAB — ECHOCARDIOGRAM COMPLETE
AR max vel: 1.98 cm2
AV Area VTI: 1.99 cm2
AV Area mean vel: 1.9 cm2
AV Mean grad: 7 mm[Hg]
AV Peak grad: 11.7 mm[Hg]
Ao pk vel: 1.71 m/s
Area-P 1/2: 2.38 cm2
S' Lateral: 3.2 cm

## 2023-06-13 ENCOUNTER — Encounter (HOSPITAL_COMMUNITY): Payer: Self-pay

## 2023-06-25 ENCOUNTER — Telehealth: Payer: Self-pay | Admitting: Cardiology

## 2023-06-25 NOTE — Telephone Encounter (Signed)
 Outpatient service line: Tachycardia  She has recent diagnosis of atrial fibrillation status post successful DCCV.  She is reporting that she had her second episode of tachycardia with heart rates in the 160s noted on her Kardia mobile device but shortly after quickly converted back to sinus rhythm based off the machine.  She feels asymptomatic now and doing much better.  It is possible that she is paroxysmal and she is going to go in and out of this but she is anticoagulated compliant and on Lopressor 50 mg twice daily.  Discussed ER precautions if she has sustained rates and symptomatic.  Also encouraged her to call us back for other questions concerns.  Instructed her she can take another extra dose of her Lopressor 50 mg for sustained episodes if blood pressure is reasonable.  She is agreeable to plan.

## 2023-08-26 ENCOUNTER — Ambulatory Visit: Payer: No Typology Code available for payment source | Attending: Internal Medicine | Admitting: Internal Medicine

## 2023-08-26 ENCOUNTER — Encounter: Payer: Self-pay | Admitting: Internal Medicine

## 2023-08-26 VITALS — BP 124/82 | HR 67 | Ht 66.0 in | Wt 258.0 lb

## 2023-08-26 DIAGNOSIS — G4733 Obstructive sleep apnea (adult) (pediatric): Secondary | ICD-10-CM | POA: Diagnosis not present

## 2023-08-26 DIAGNOSIS — I480 Paroxysmal atrial fibrillation: Secondary | ICD-10-CM | POA: Diagnosis not present

## 2023-08-26 DIAGNOSIS — D6869 Other thrombophilia: Secondary | ICD-10-CM

## 2023-08-26 DIAGNOSIS — E782 Mixed hyperlipidemia: Secondary | ICD-10-CM

## 2023-08-26 NOTE — Patient Instructions (Signed)
 Medication Instructions:  Your physician recommends that you continue on your current medications as directed. Please refer to the Current Medication list given to you today.  *If you need a refill on your cardiac medications before your next appointment, please call your pharmacy*  Lab Work: [place]- Fasting lipid panel and Lpa (nothing to eat or drink 12 hours prior except water)  If you have labs (blood work) drawn today and your tests are completely normal, you will receive your results only by: MyChart Message (if you have MyChart) OR A paper copy in the mail If you have any lab test that is abnormal or we need to change your treatment, we will call you to review the results.  Testing/Procedures: NONE  Follow-Up: At [facility], you and your health needs are our priority.  As part of our continuing mission to provide you with exceptional heart care, our providers are all part of one team.  This team includes your primary Cardiologist (physician) and Advanced Practice Providers or APPs (Physician Assistants and Nurse Practitioners) who all work together to provide you with the care you need, when you need it.  Your next appointment:   12 month(s)  Provider:   Jann Melody, MD     Other Instructions Please notify our office if you have further episodes of Atrial Fibrillation.

## 2023-08-26 NOTE — Progress Notes (Signed)
 Cardiology Office Note:  .    Date:  08/26/2023  ID:  [name], DOB 1960/05/23, MRN [id number] PCP: Lieutenant Reese., MD  [facility] Providers Cardiologist:  Jann Melody, MD     CC: AF Prevention Consulted for the evaluation of AF at the behest of Dr. Laymon Priest  History of Present Illness: .    Sara Page is a 63 y.o. female with paroxysmal atrial fibrillation who presents for follow-up.  She has a history of paroxysmal atrial fibrillation, initially experiencing symptoms on her birthday, which required an ambulance ride and treatment with Cardizem, though it was ineffective. She was subsequently cardioverted at [facility], resolving her symptoms. A second episode occurred a month later, managed at home with ice packs, and she has not had further episodes since then. She is currently on anticoagulation therapy due to her atrial fibrillation and associated stroke risk factors, including hypertension, diabetes, and being female.  Her medical history includes renal cell carcinoma, hypertension, diabetes, hyperlipidemia, and obstructive sleep apnea. 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 She has a severe allergy to lice.  She has a history of pulmonary embolism following kidney cancer surgery, which was considered provoked, and she was not on long-term anticoagulation initially. Currently, she is on blood thinners primarily for atrial fibrillation risk management.  She reports a family history of heart issues; her father had a pacemaker and uncontrolled diabetes, which led to heart problems. She does not smoke or drink alcohol and admits to not exercising as much as she should.  She has a history of obstructive sleep apnea, diagnosed many years ago, and currently sleeps in a medical recliner due to difficulty lying flat. She previously tried CPAP therapy but  could not tolerate it. She reports an average oxygen saturation of 94% at night.  She has tried Ozempic for diabetes and weight loss but discontinued it due to heart racing. She has lost about 30 pounds over the past few years. She is concerned about her cholesterol levels, which are managed with Crestor, and has a family history of high cholesterol.  Discussed the use of AI scribe software for clinical note transcription with the patient, who gave verbal consent to proceed.   Relevant histories: .  Social- family history of heart disease (father), does not like to drink; does not smoke ROS: As per HPI.   Studies Reviewed: .   Cardiac Studies & Procedures   ______________________________________________________________________________________________     ECHOCARDIOGRAM  ECHOCARDIOGRAM COMPLETE 06/11/2023  Narrative ECHOCARDIOGRAM REPORT    Patient Name:   LATREASE Page [facility] Date of Exam: 06/11/2023 Medical Rec #:  [id number]         Height:       66.0 in Accession #:    [id number]        Weight:       261.0 lb Date of Birth:  [birth year]         BSA:          2.239 m Patient Age:    63 years          BP:           146/76 mmHg Patient Gender: F                 HR:           79 bpm. Exam Location:  Outpatient  Procedure: 2D  Echo, Cardiac Doppler and Color Doppler (Both Spectral and Color Flow Doppler were utilized during procedure).  Indications:    Atrial Fibrillation I48.91  History:        Patient has no prior history of Echocardiogram examinations. Arrythmias:Atrial Fibrillation; Risk Factors:Diabetes and Hypertension.  Sonographer:    Astrid Blamer Referring Phys: [id number] CLINT R FENTON  IMPRESSIONS   1. Left ventricular ejection fraction, by estimation, is 60 to 65%. The left ventricle has normal function. Left ventricular endocardial border not optimally defined to evaluate regional wall motion. Left ventricular diastolic parameters are consistent with Grade I  diastolic dysfunction (impaired relaxation). 2. Right ventricular systolic function is normal. The right ventricular size is normal. 3. The mitral valve is degenerative. Trivial mitral valve regurgitation. No evidence of mitral stenosis. 4. The aortic valve is normal in structure. Aortic valve regurgitation is not visualized. No aortic stenosis is present. 5. The inferior vena cava is normal in size with greater than 50% respiratory variability, suggesting right atrial pressure of 3 mmHg.  FINDINGS Left Ventricle: Left ventricular ejection fraction, by estimation, is 60 to 65%. The left ventricle has normal function. Left ventricular endocardial border not optimally defined to evaluate regional wall motion. Strain imaging was not performed. The left ventricular internal cavity size was normal in size. There is no left ventricular hypertrophy. Left ventricular diastolic parameters are consistent with Grade I diastolic dysfunction (impaired relaxation). Normal left ventricular filling pressure.  Right Ventricle: The right ventricular size is normal. No increase in right ventricular wall thickness. Right ventricular systolic function is normal.  Left Atrium: Left atrial size was normal in size.  Right Atrium: Right atrial size was normal in size.  Pericardium: There is no evidence of pericardial effusion.  Mitral Valve: The mitral valve is degenerative in appearance. Mild to moderate mitral annular calcification. Trivial mitral valve regurgitation. No evidence of mitral valve stenosis.  Tricuspid Valve: The tricuspid valve is normal in structure. Tricuspid valve regurgitation is trivial. No evidence of tricuspid stenosis.  Aortic Valve: The aortic valve is normal in structure. Aortic valve regurgitation is not visualized. No aortic stenosis is present. Aortic valve mean gradient measures 7.0 mmHg. Aortic valve peak gradient measures 11.7 mmHg. Aortic valve area, by VTI measures 1.99  cm.  Pulmonic Valve: The pulmonic valve was normal in structure. Pulmonic valve regurgitation is not visualized. No evidence of pulmonic stenosis.  Aorta: The aortic root is normal in size and structure.  Venous: The inferior vena cava is normal in size with greater than 50% respiratory variability, suggesting right atrial pressure of 3 mmHg.  IAS/Shunts: No atrial level shunt detected by color flow Doppler.  Additional Comments: 3D imaging was not performed.   LEFT VENTRICLE PLAX 2D LVIDd:         4.80 cm   Diastology LVIDs:         3.20 cm   LV e' medial:    6.20 cm/s LV PW:         1.20 cm   LV E/e' medial:  14.0 LV IVS:        1.00 cm   LV e' lateral:   8.49 cm/s LVOT diam:     1.80 cm   LV E/e' lateral: 10.2 LV SV:         74 LV SV Index:   33 LVOT Area:     2.54 cm   RIGHT VENTRICLE RV S prime:     19.10 cm/s TAPSE (M-mode): 2.3 cm  LEFT  ATRIUM             Index        RIGHT ATRIUM           Index LA Vol (A2C):   46.7 ml 20.85 ml/m  RA Area:     14.00 cm LA Vol (A4C):   50.5 ml 22.55 ml/m  RA Volume:   28.50 ml  12.73 ml/m LA Biplane Vol: 50.0 ml 22.33 ml/m AORTIC VALVE AV Area (Vmax):    1.98 cm AV Area (Vmean):   1.90 cm AV Area (VTI):     1.99 cm AV Vmax:           171.00 cm/s AV Vmean:          129.000 cm/s AV VTI:            0.371 m AV Peak Grad:      11.7 mmHg AV Mean Grad:      7.0 mmHg LVOT Vmax:         133.00 cm/s LVOT Vmean:        96.300 cm/s LVOT VTI:          0.290 m LVOT/AV VTI ratio: 0.78  AORTA Ao Root diam: 3.00 cm  MITRAL VALVE                TRICUSPID VALVE MV Area (PHT): 2.38 cm     TR Peak grad:   15.2 mmHg MV Decel Time: 319 msec     TR Vmax:        195.00 cm/s MV E velocity: 86.50 cm/s MV A velocity: 116.00 cm/s  SHUNTS MV E/A ratio:  0.75         Systemic VTI:  0.29 m Systemic Diam: 1.80 cm  Gaylyn Keas MD Electronically signed by Gaylyn Keas MD Signature Date/Time: 06/11/2023/2:06:12 PM    Final           ______________________________________________________________________________________________        Physical Exam:    VS:  BP 124/82 (BP Location: Left Arm)   Pulse 67   Ht 5\' 6"  (1.676 m)   Wt 117 kg   SpO2 97%   BMI 41.64 kg/m    Wt Readings from Last 3 Encounters:  08/26/23 117 kg  05/27/23 118.4 kg  05/25/23 116.1 kg    Gen: no distress  Morbid obesity Neck: No JVD Cardiac: No Rubs or Gallops, no murmur, RRR +2 radial pulses Respiratory: Clear to auscultation bilaterally, normal effort, normal  respiratory rate GI: Soft, nontender, non-distended  MS: No  edema;  moves all extremities Integument: Skin feels warm Neuro:  At time of evaluation, alert and oriented to person/place/time/situation  Psych: Normal affect, patient feels fair   ASSESSMENT AND PLAN: .    An EKG was ordered for PAF and shows SR  Paroxysmal atrial fibrillation Paroxysmal atrial fibrillation with two episodes in the past year. Currently in sinus rhythm on EKG. CHADS-VASc score of 3 due to age, hypertension, and diabetes, indicating a high risk of stroke. Discussed potential for future episodes and options of antiarrhythmic drugs or ablation if episodes recur. She expressed concern about recurrence and was informed about the possibility of a stress test and further interventions if necessary. - Continue anticoagulation therapy; I have reviewed that, to my knowledge, I do not have a contraindication to Vitamin K2 on DOAC - Continue metoprolol - If another episode occurs, consented for a stress test - Consider antiarrhythmic drugs or ablation if episodes recur (  flecainide vs PFA  Obstructive sleep apnea Obstructive sleep apnea with previous CPAP intolerance. Discussed alternative treatments such as mouth guards and the Inspire device. Treatment of sleep apnea may reduce atrial fibrillation episodes. She is considering options and may pursue further evaluation. - Consider referral to Dr. Micael Adas  for obstructive sleep apnea evaluation (mouthguard vs inspire) - Consider home sleep study; she is contemplative  Hypertension Hypertension is well controlled under current regimen. - Continue current antihypertensive regimen; hx of lisinopril allergy (severe)  Hyperlipidemia Hyperlipidemia with statin myopathy, currently tolerating Crestor three times a week. Family history of hypercholesterolemia. Offered a coronary artery calcium score to assess the need for more aggressive treatment. Discussed potential for genetic factors influencing cholesterol levels and the importance of monitoring. - Offered coronary artery calcium score - Check cholesterol labs in the fall, including LP(a)  Type 2 diabetes mellitus Type 2 diabetes mellitus with elevated blood sugars. Previous trial of GLP-1 receptor agonist therapy (Ozempic) resulted in adverse effects, including heart racing. Discussed ineffectiveness of GLP-1 therapy for her and the need for alternative management strategies.  One year with me or my team unless further AF   Gloriann Larger, MD FASE [facility] Cardiologist [facility]  [address] [place], [place] [address] [phone]  12:25 PM

## 2023-11-04 ENCOUNTER — Other Ambulatory Visit (HOSPITAL_COMMUNITY): Payer: Self-pay | Admitting: Physician Assistant

## 2023-11-04 NOTE — Telephone Encounter (Signed)
 Prescription refill request for Eliquis  received. Indication: PAF Last office visit: 08/26/23  CHRISTELLA Leavens MD Scr: 0.52 on 09/24/23  Epic Age: 63 Weight: 117kg  Based on above findings Eliquis  5mg  twice daily is the appropriate dose.  Refill approved.

## 2024-03-11 ENCOUNTER — Ambulatory Visit: Payer: Self-pay | Admitting: Internal Medicine

## 2024-03-12 LAB — LIPID PANEL
Chol/HDL Ratio: 3.6 ratio (ref 0.0–4.4)
Cholesterol, Total: 127 mg/dL (ref 100–199)
HDL: 35 mg/dL — ABNORMAL LOW (ref >39)
LDL Chol Calc (NIH): 69 mg/dL (ref 0–99)
Triglycerides: 127 mg/dL (ref 0–149)
VLDL Cholesterol Cal: 23 mg/dL (ref 5–40)

## 2024-03-12 LAB — LIPOPROTEIN A (LPA): Lipoprotein (a): 54.7 nmol/L (ref <75.0)

## 2024-03-22 ENCOUNTER — Ambulatory Visit: Admitting: Internal Medicine

## 2024-03-29 ENCOUNTER — Ambulatory Visit: Attending: Internal Medicine | Admitting: Internal Medicine

## 2024-03-29 VITALS — BP 114/75 | HR 71 | Ht 66.0 in | Wt 256.0 lb

## 2024-03-29 DIAGNOSIS — I480 Paroxysmal atrial fibrillation: Secondary | ICD-10-CM | POA: Diagnosis not present

## 2024-03-29 DIAGNOSIS — E782 Mixed hyperlipidemia: Secondary | ICD-10-CM | POA: Diagnosis not present

## 2024-03-29 DIAGNOSIS — D6869 Other thrombophilia: Secondary | ICD-10-CM

## 2024-03-29 DIAGNOSIS — G4733 Obstructive sleep apnea (adult) (pediatric): Secondary | ICD-10-CM

## 2024-03-29 NOTE — Progress Notes (Signed)
 Cardiology Office Note:  .    Date:  03/29/2024  ID:  Sara Page, DOB 01/14/1961, MRN [id number] PCP: Andrew Truman GRADE., MD  [facility] Providers Cardiologist:  Stanly DELENA Leavens, MD     CC: AF Prevention  History of Present Illness: .    Sara Page is a 63 y.o. female  with paroxysmal atrial fibrillation who presents for follow-up regarding her condition.  She has a history of paroxysmal atrial fibrillation, initially presenting with symptoms on her birthday, which required cardioversion. It has been ten months since her last episode of atrial fibrillation. She describes the episodes as 'pretty symptomatic' and 'bad,' particularly when she was in the emergency room. She is currently on Eliquis  and metoprolol, which she was on prior to her last episode. She has experienced occasional skipped beats, which could be small runs of atrial fibrillation or premature atrial contractions, but she has not noticed these in the last two weeks.  She has a history of hypertension and diabetes, both of which are well controlled. Her blood pressure has been well controlled, and her cholesterol levels have been managed with her current therapy, with LDL under seventy. She is not on long-term anticoagulation for her history of provoked pulmonary embolism.  She has obstructive sleep apnea and was unable to tolerate CPAP therapy. She has tried GLP-1 therapy, specifically Ozempic, but experienced a racing heart and did not lose weight, although it did lower her blood sugar. She sleeps in a medical recliner as she finds it uncomfortable to lay flat.  She has a past medical history of kidney cancer, for which she underwent a partial nephrectomy, during which her lung was punctured, leading to a prolonged recovery. She does not smoke or drink alcohol.   Discussed the use of AI scribe software for clinical note transcription with the patient, who gave verbal consent to  proceed.   Relevant histories: .  Social- family history of heart disease (father), does not like to drink; does not smoke 2024: Established care; AF on her birthday ROS: As per HPI.   Studies Reviewed: .   Cardiac Studies & Procedures   ______________________________________________________________________________________________     ECHOCARDIOGRAM  ECHOCARDIOGRAM COMPLETE 06/11/2023  Narrative ECHOCARDIOGRAM REPORT    Patient Name:   Sara Page [facility] Date of Exam: 06/11/2023 Medical Rec #:  [id number]         Height:       66.0 in Accession #:    [id number]        Weight:       261.0 lb Date of Birth:  1960-08-27         BSA:          2.239 m Patient Age:    63 years          BP:           146/76 mmHg Patient Gender: F                 HR:           79 bpm. Exam Location:  Outpatient  Procedure: 2D Echo, Cardiac Doppler and Color Doppler (Both Spectral and Color Flow Doppler were utilized during procedure).  Indications:    Atrial Fibrillation I48.91  History:        Patient has no prior history of Echocardiogram examinations. Arrythmias:Atrial Fibrillation; Risk Factors:Diabetes and Hypertension.  Sonographer:    Jayson Gaskins Referring Phys: [id number] CLINT R FENTON  IMPRESSIONS   1.  Left ventricular ejection fraction, by estimation, is 60 to 65%. The left ventricle has normal function. Left ventricular endocardial border not optimally defined to evaluate regional wall motion. Left ventricular diastolic parameters are consistent with Grade I diastolic dysfunction (impaired relaxation). 2. Right ventricular systolic function is normal. The right ventricular size is normal. 3. The mitral valve is degenerative. Trivial mitral valve regurgitation. No evidence of mitral stenosis. 4. The aortic valve is normal in structure. Aortic valve regurgitation is not visualized. No aortic stenosis is present. 5. The inferior vena cava is normal in size with greater than 50%  respiratory variability, suggesting right atrial pressure of 3 mmHg.  FINDINGS Left Ventricle: Left ventricular ejection fraction, by estimation, is 60 to 65%. The left ventricle has normal function. Left ventricular endocardial border not optimally defined to evaluate regional wall motion. Strain imaging was not performed. The left ventricular internal cavity size was normal in size. There is no left ventricular hypertrophy. Left ventricular diastolic parameters are consistent with Grade I diastolic dysfunction (impaired relaxation). Normal left ventricular filling pressure.  Right Ventricle: The right ventricular size is normal. No increase in right ventricular wall thickness. Right ventricular systolic function is normal.  Left Atrium: Left atrial size was normal in size.  Right Atrium: Right atrial size was normal in size.  Pericardium: There is no evidence of pericardial effusion.  Mitral Valve: The mitral valve is degenerative in appearance. Mild to moderate mitral annular calcification. Trivial mitral valve regurgitation. No evidence of mitral valve stenosis.  Tricuspid Valve: The tricuspid valve is normal in structure. Tricuspid valve regurgitation is trivial. No evidence of tricuspid stenosis.  Aortic Valve: The aortic valve is normal in structure. Aortic valve regurgitation is not visualized. No aortic stenosis is present. Aortic valve mean gradient measures 7.0 mmHg. Aortic valve peak gradient measures 11.7 mmHg. Aortic valve area, by VTI measures 1.99 cm.  Pulmonic Valve: The pulmonic valve was normal in structure. Pulmonic valve regurgitation is not visualized. No evidence of pulmonic stenosis.  Aorta: The aortic root is normal in size and structure.  Venous: The inferior vena cava is normal in size with greater than 50% respiratory variability, suggesting right atrial pressure of 3 mmHg.  IAS/Shunts: No atrial level shunt detected by color flow Doppler.  Additional  Comments: 3D imaging was not performed.   LEFT VENTRICLE PLAX 2D LVIDd:         4.80 cm   Diastology LVIDs:         3.20 cm   LV e' medial:    6.20 cm/s LV PW:         1.20 cm   LV E/e' medial:  14.0 LV IVS:        1.00 cm   LV e' lateral:   8.49 cm/s LVOT diam:     1.80 cm   LV E/e' lateral: 10.2 LV SV:         74 LV SV Index:   33 LVOT Area:     2.54 cm   RIGHT VENTRICLE RV S prime:     19.10 cm/s TAPSE (M-mode): 2.3 cm  LEFT ATRIUM             Index        RIGHT ATRIUM           Index LA Vol (A2C):   46.7 ml 20.85 ml/m  RA Area:     14.00 cm LA Vol (A4C):   50.5 ml 22.55 ml/m  RA Volume:  28.50 ml  12.73 ml/m LA Biplane Vol: 50.0 ml 22.33 ml/m AORTIC VALVE AV Area (Vmax):    1.98 cm AV Area (Vmean):   1.90 cm AV Area (VTI):     1.99 cm AV Vmax:           171.00 cm/s AV Vmean:          129.000 cm/s AV VTI:            0.371 m AV Peak Grad:      11.7 mmHg AV Mean Grad:      7.0 mmHg LVOT Vmax:         133.00 cm/s LVOT Vmean:        96.300 cm/s LVOT VTI:          0.290 m LVOT/AV VTI ratio: 0.78  AORTA Ao Root diam: 3.00 cm  MITRAL VALVE                TRICUSPID VALVE MV Area (PHT): 2.38 cm     TR Peak grad:   15.2 mmHg MV Decel Time: 319 msec     TR Vmax:        195.00 cm/s MV E velocity: 86.50 cm/s MV A velocity: 116.00 cm/s  SHUNTS MV E/A ratio:  0.75         Systemic VTI:  0.29 m Systemic Diam: 1.80 cm  Wilbert Bihari MD Electronically signed by Wilbert Bihari MD Signature Date/Time: 06/11/2023/2:06:12 PM    Final          ______________________________________________________________________________________________        Physical Exam:    VS:  BP 114/75   Pulse 71   Ht 5' 6 (1.676 m)   Wt 256 lb (116.1 kg)   SpO2 93%   BMI 41.32 kg/m    Wt Readings from Last 3 Encounters:  03/29/24 256 lb (116.1 kg)  08/26/23 258 lb (117 kg)  05/27/23 261 lb (118.4 kg)    Gen: No distress  Morbid obesity Neck: No JVD Cardiac: No Rubs or  Gallops, no murmur, RRR +2 radial pulses Respiratory: Clear to auscultation bilaterally, normal effort, normal  respiratory rate GI: Soft, nontender, non-distended  MS: No edema;  moves all extremities Integument: Skin feels warm Neuro:  At time of evaluation, alert and oriented to person/place/time/situation  Psych: Normal affect, patient feels fair   ASSESSMENT AND PLAN: .    Paroxysmal atrial fibrillation No episodes in the past ten months. Previous episodes were symptomatic, requiring cardioversion. Currently on metoprolol and Eliquis . Discussed potential for recurrence and management options including increasing metoprolol, antiarrhythmic drugs, or ablation. Emphasized importance of monitoring for symptoms and potential need for emergency intervention if symptoms worsen. Discussed that atrial fibrillation can recur despite treatment, including ablation. Consideration of ablation if symptoms worsen and medications are not tolerated. - Continue metoprolol and Eliquis . - Monitor for symptoms of atrial fibrillation. - Will consider increasing metoprolol or adding diltiazem/verapamil if symptoms worsen. - Will consider antiarrhythmic drugs like flecainide or propafenone if structural heart issues are ruled out. - Will consider ablation if symptoms worsen and medications are not tolerated. - Use an online heart monitor if palpitations increase to differentiate between AFib and SVT.  Essential hypertension Blood pressure is well controlled on current therapy. - Continue current antihypertensive therapy.  Type 2 diabetes mellitus Diabetes is well controlled. Previous intolerance to GLP-1 therapy noted. - Continue current diabetes management.  Obstructive sleep apnea Unable to tolerate CPAP. Discussed alternative treatments including GLP-1 medications,  mouth guards, and the Inspire device. Current management includes sleeping in a medical recliner. No severe symptoms suggestive of severe  obstructive sleep apnea. - Consider alternative treatments for obstructive sleep apnea if symptoms worsen.  One year with me or my team unless further AF   Stanly Leavens, MD FASE [facility] Cardiologist [facility]  [address] [place], [address] [place], [place] [address] [phone]  12:18 PM

## 2024-03-29 NOTE — Patient Instructions (Signed)
 Medication Instructions:  NO CHANGES *If you need a refill on your cardiac medications before your next appointment, please call your pharmacy*   Follow-Up: At [facility], you and your health needs are our priority.  As part of our continuing mission to provide you with exceptional heart care, our providers are all part of one team.  This team includes your primary Cardiologist (physician) and Advanced Practice Providers or APPs (Physician Assistants and Nurse Practitioners) who all work together to provide you with the care you need, when you need it.  Your next appointment:   12 months with Dr. Santo   We recommend signing up for the patient portal called MyChart.  Sign up information is provided on this After Visit Summary.  MyChart is used to connect with patients for Virtual Visits (Telemedicine).  Patients are able to view lab/test results, encounter notes, upcoming appointments, etc.  Non-urgent messages can be sent to your provider as well.   To learn more about what you can do with MyChart, go to [email or web address].   Other Instructions

## 2024-04-26 ENCOUNTER — Other Ambulatory Visit: Payer: Self-pay | Admitting: Internal Medicine

## 2024-04-26 NOTE — Telephone Encounter (Signed)
 Prescription refill request for Eliquis  received. Indication:afib Last office visit:12/25 Scr: 0.53  12/25 Age:63 Weight:116.1  kg  Prescription refilled
